# Patient Record
Sex: Female | Born: 1970 | Hispanic: Yes | Marital: Married | Smoking: Never smoker
Health system: Southern US, Community
[De-identification: ages and names within clinical notes are randomized; demographics above are authoritative.]

## PROBLEM LIST (undated history)

## (undated) DIAGNOSIS — E785 Hyperlipidemia, unspecified: Secondary | ICD-10-CM

## (undated) DIAGNOSIS — I10 Essential (primary) hypertension: Secondary | ICD-10-CM

## (undated) HISTORY — DX: Essential (primary) hypertension: I10

## (undated) HISTORY — PX: OTHER SURGICAL HISTORY: SHX169

## (undated) HISTORY — DX: Hyperlipidemia, unspecified: E78.5

---

## 2010-05-18 ENCOUNTER — Other Ambulatory Visit (HOSPITAL_COMMUNITY): Payer: Self-pay | Admitting: Family Medicine

## 2010-05-18 DIAGNOSIS — Z139 Encounter for screening, unspecified: Secondary | ICD-10-CM

## 2010-05-21 ENCOUNTER — Ambulatory Visit (HOSPITAL_COMMUNITY)
Admission: RE | Admit: 2010-05-21 | Discharge: 2010-05-21 | Disposition: A | Discharge status: Home / Self Care | Payer: Self-pay | Source: Ambulatory Visit | Attending: Family Medicine | Admitting: Family Medicine

## 2010-05-21 DIAGNOSIS — Z139 Encounter for screening, unspecified: Secondary | ICD-10-CM

## 2011-07-12 ENCOUNTER — Other Ambulatory Visit (HOSPITAL_COMMUNITY): Payer: Self-pay | Admitting: Nurse Practitioner

## 2011-07-12 DIAGNOSIS — Z139 Encounter for screening, unspecified: Secondary | ICD-10-CM

## 2011-07-15 ENCOUNTER — Ambulatory Visit (HOSPITAL_COMMUNITY)
Admission: RE | Admit: 2011-07-15 | Discharge: 2011-07-15 | Disposition: A | Discharge status: Home / Self Care | Payer: PRIVATE HEALTH INSURANCE | Source: Ambulatory Visit | Attending: Nurse Practitioner | Admitting: Nurse Practitioner

## 2011-07-15 DIAGNOSIS — Z1231 Encounter for screening mammogram for malignant neoplasm of breast: Secondary | ICD-10-CM | POA: Insufficient documentation

## 2011-07-15 DIAGNOSIS — Z139 Encounter for screening, unspecified: Secondary | ICD-10-CM

## 2013-02-05 ENCOUNTER — Encounter: Payer: Self-pay | Admitting: Gastroenterology

## 2013-02-28 ENCOUNTER — Encounter: Payer: Self-pay | Admitting: Gastroenterology

## 2013-02-28 ENCOUNTER — Ambulatory Visit (INDEPENDENT_AMBULATORY_CARE_PROVIDER_SITE_OTHER): Payer: Self-pay | Admitting: Gastroenterology

## 2013-02-28 ENCOUNTER — Encounter (INDEPENDENT_AMBULATORY_CARE_PROVIDER_SITE_OTHER): Payer: Self-pay

## 2013-02-28 VITALS — BP 170/97 | HR 77 | Temp 97.6°F | Ht 61.0 in | Wt 175.6 lb

## 2013-02-28 DIAGNOSIS — K644 Residual hemorrhoidal skin tags: Secondary | ICD-10-CM

## 2013-02-28 NOTE — Progress Notes (Signed)
Primary Care Physician:  Tylene FantasiaMUSE,ROCHELLE D., PA-C Primary Gastroenterologist:  Dr. Jena Gaussourk   Chief Complaint  Patient presents with  . Hemorrhoids    HPI:   Lauren Brock is a pleasant 43 year old female who presents today at the request of Greer EeHeather Parrish, FNP with the [facility]. She was referred due to question of a hemorrhoid versus possible mass. An interpreter was present for any language barriers. However, patient speaks [place] well.   No rectal pain. When area came out at first, itched and burning. Started Proctosol with improvement of symptoms. No rectal bleeding. Sometimes coffee stimulates to have a bowel movement. No straining with BMs. No significant change in bowel habits. Believes she was constipated when hemorrhoids flared.   Hypertensive, 170/91. Has appt with primary care in a few days.   Past Medical History  Diagnosis Date  . Hypertension     Past Surgical History  Procedure Laterality Date  . None      No current outpatient prescriptions on file.   No current facility-administered medications for this visit.    Allergies as of 02/28/2013  . (No Known Allergies)    Family History  Problem Relation Age of Onset  . Colon cancer Neg Hx     History   Social History  . Marital Status: Married    Spouse Name: N/A    Number of Children: N/A  . Years of Education: N/A   Occupational History  . Not on file.   Social History Main Topics  . Smoking status: Never Smoker   . Smokeless tobacco: Not on file  . Alcohol Use: No  . Drug Use: No  . Sexual Activity: Not on file   Other Topics Concern  . Not on file   Social History Narrative  . No narrative on file    Review of Systems: Gen: Denies any fever, chills, fatigue, weight loss, lack of appetite.  CV: Denies chest pain, heart palpitations, peripheral edema, syncope. Denies blurred vision, significant headaches Resp: +DOE GI: see HPI GU : Denies urinary burning, urinary  frequency, urinary hesitancy MS: right wrist chronic pain from repetitive movement Derm: Denies rash, itching, dry skin Psych: Denies depression, anxiety, memory loss, and confusion Heme: Denies bruising, bleeding, and enlarged lymph nodes.  Physical Exam: BP 170/97  Pulse 77  Temp(Src) 97.6 F (36.4 C) (Oral)  Ht 5\' 1"  (1.549 m)  Wt 175 lb 9.6 oz (79.652 kg)  BMI 33.20 kg/m2  LMP 02/14/2013 General:   Alert and oriented. Pleasant and cooperative. Well-nourished and well-developed.  Head:  Normocephalic and atraumatic. Eyes:  Without icterus, sclera clear and conjunctiva pink.  Ears:  Normal auditory acuity. Nose:  No deformity, discharge,  or lesions. Mouth:  No deformity or lesions, oral mucosa pink.  Neck:  Supple, without mass or thyromegaly. Lungs:  Clear to auscultation bilaterally. No wheezes, rales, or rhonchi. No distress.  Heart:  S1, S2 present without murmurs appreciated.  Abdomen:  +BS, soft, non-tender and non-distended. No HSM noted. No guarding or rebound. No masses appreciated.  Rectal:  External exam with small, raised area consistent with resolving external hemorrhoid; internal exam without mass, stricture, gross blood, or discomfort.  Msk:  Symmetrical without gross deformities. Normal posture. Extremities:  Without clubbing or edema. Neurologic:  Alert and  oriented x4;  grossly normal neurologically. Skin:  Intact without significant lesions or rashes. Cervical Nodes:  No significant cervical adenopathy. Psych:  Alert and cooperative. Normal mood and affect.

## 2013-02-28 NOTE — Patient Instructions (Signed)
Continue to use the hemorrhoid cream twice a day for 7 days.   We will see you back in 4 weeks. Do not strain or sit on the toilet for long periods of time. Take extra fiber daily such as Metamucil or Benefiber.   Hemorrhoids Hemorrhoids are swollen veins around the rectum or anus. There are two types of hemorrhoids:   Internal hemorrhoids. These occur in the veins just inside the rectum. They may poke through to the outside and become irritated and painful.  External hemorrhoids. These occur in the veins outside the anus and can be felt as a painful swelling or hard lump near the anus. CAUSES  Pregnancy.   Obesity.   Constipation or diarrhea.   Straining to have a bowel movement.   Sitting for long periods on the toilet.  Heavy lifting or other activity that caused you to strain.  Anal intercourse. SYMPTOMS   Pain.   Anal itching or irritation.   Rectal bleeding.   Fecal leakage.   Anal swelling.   One or more lumps around the anus.  DIAGNOSIS  Your caregiver may be able to diagnose hemorrhoids by visual examination. Other examinations or tests that may be performed include:   Examination of the rectal area with a gloved hand (digital rectal exam).   Examination of anal canal using a small tube (scope).   A blood test if you have lost a significant amount of blood.  A test to look inside the colon (sigmoidoscopy or colonoscopy). TREATMENT Most hemorrhoids can be treated at home. However, if symptoms do not seem to be getting better or if you have a lot of rectal bleeding, your caregiver may perform a procedure to help make the hemorrhoids get smaller or remove them completely. Possible treatments include:   Placing a rubber band at the base of the hemorrhoid to cut off the circulation (rubber band ligation).   Injecting a chemical to shrink the hemorrhoid (sclerotherapy).   Using a tool to burn the hemorrhoid (infrared light therapy).    Surgically removing the hemorrhoid (hemorrhoidectomy).   Stapling the hemorrhoid to block blood flow to the tissue (hemorrhoid stapling).  HOME CARE INSTRUCTIONS   Eat foods with fiber, such as whole grains, beans, nuts, fruits, and vegetables. Ask your doctor about taking products with added fiber in them (fibersupplements).  Increase fluid intake. Drink enough water and fluids to keep your urine clear or pale yellow.   Exercise regularly.   Go to the bathroom when you have the urge to have a bowel movement. Do not wait.   Avoid straining to have bowel movements.   Keep the anal area dry and clean. Use wet toilet paper or moist towelettes after a bowel movement.   Medicated creams and suppositories may be used or applied as directed.   Only take over-the-counter or prescription medicines as directed by your caregiver.   Take warm sitz baths for 15 20 minutes, 3 4 times a day to ease pain and discomfort.   Place ice packs on the hemorrhoids if they are tender and swollen. Using ice packs between sitz baths may be helpful.   Put ice in a plastic bag.   Place a towel between your skin and the bag.   Leave the ice on for 15 20 minutes, 3 4 times a day.   Do not use a donut-shaped pillow or sit on the toilet for long periods. This increases blood pooling and pain.  SEEK MEDICAL CARE IF:  You  have increasing pain and swelling that is not controlled by treatment or medicine.  You have uncontrolled bleeding.  You have difficulty or you are unable to have a bowel movement.  You have pain or inflammation outside the area of the hemorrhoids. MAKE SURE YOU:  Understand these instructions.  Will watch your condition.  Will get help right away if you are not doing well or get worse. Document Released: 01/16/2000 Document Revised: 01/05/2012 Document Reviewed: 11/23/2011 [employer] Patient Information 2014 [employer] [place].

## 2013-03-01 DIAGNOSIS — K644 Residual hemorrhoidal skin tags: Secondary | ICD-10-CM | POA: Insufficient documentation

## 2013-03-01 NOTE — Assessment & Plan Note (Signed)
43 year old female with small, resolving external hemorrhoid and no concerning signs such as rectal bleeding, change in bowel habits, or other red flags. From her description, the external hemorrhoid was much larger and has significantly decreased in size over past few weeks. I have asked her to use Proctosol BID for another 7 days and add additional fiber to her diet. We also discussed avoidance of straining and sitting for long periods of time. For now, colonoscopy not warranted unless signs of rectal bleeding. Close follow-up in 4 weeks.

## 2013-03-02 ENCOUNTER — Ambulatory Visit (HOSPITAL_COMMUNITY)
Admission: RE | Admit: 2013-03-02 | Discharge: 2013-03-02 | Disposition: A | Discharge status: Home / Self Care | Payer: Self-pay | Source: Ambulatory Visit | Attending: *Deleted | Admitting: *Deleted

## 2013-03-02 ENCOUNTER — Other Ambulatory Visit (HOSPITAL_COMMUNITY): Payer: Self-pay | Admitting: *Deleted

## 2013-03-02 DIAGNOSIS — Z1231 Encounter for screening mammogram for malignant neoplasm of breast: Secondary | ICD-10-CM

## 2013-03-06 NOTE — Progress Notes (Signed)
cc'd to pcp 

## 2013-03-21 ENCOUNTER — Telehealth: Payer: Self-pay

## 2013-03-21 NOTE — Telephone Encounter (Signed)
FAXED OV NOTE TO [facility] AT THE [facility].

## 2013-03-21 NOTE — Telephone Encounter (Signed)
Pt was referred from the health dept. Phone call from [facility], they never received info. Please send the OV note to them at fax number [phone] and Put attn [facility] on it.   Routing to [place] to do this.

## 2013-03-28 ENCOUNTER — Encounter: Payer: Self-pay | Admitting: Gastroenterology

## 2013-03-28 ENCOUNTER — Ambulatory Visit (INDEPENDENT_AMBULATORY_CARE_PROVIDER_SITE_OTHER): Payer: Self-pay | Admitting: Gastroenterology

## 2013-03-28 VITALS — BP 148/93 | HR 71 | Temp 98.2°F | Wt 176.2 lb

## 2013-03-28 DIAGNOSIS — K644 Residual hemorrhoidal skin tags: Secondary | ICD-10-CM

## 2013-03-28 NOTE — Patient Instructions (Signed)
Everything looks great!!  If you have any further issues with hemorrhoids, notice any rectal bleeding, or anything abnormal, call us right away. We will then proceed with a colonoscopy. However, we will see you back as needed from now on.   You will be due for your first colonoscopy at age 43.

## 2013-03-28 NOTE — Assessment & Plan Note (Signed)
43 year old female with improved/resolved external hemorrhoid; now only with small residual skin tag. She has had no change in bowel habits, rectal bleeding, abdominal pain. Improvement with supportive measures and prescription cream. Will see back prn. I have discussed signs and symptoms that would necessitate a colonoscopy. For now, no further intervention needed.

## 2013-03-28 NOTE — Progress Notes (Signed)
    Referring Provider: Tylene FantasiaMuse, Rochelle D., PA-C Primary Care Physician:  Tylene FantasiaMUSE,ROCHELLE D., PA-C Primary GI: Dr. Jena Gaussourk   Chief Complaint  Patient presents with  . Follow-up    HPI:   Lauren Brock presents today in follow-up with a history of hemorrhoids. No prior colonoscopy. She has had no rectal bleeding whatsoever. No change in bowel habits, abdominal pain, weight loss. No rectal discomfort. At last visit, a small, resolving external hemorrhoid was noted. Prescription cream provided to patient, and she has used this with great results.    Past Medical History  Diagnosis Date  . Hypertension     Past Surgical History  Procedure Laterality Date  . None      Current Outpatient Prescriptions  Medication Sig Dispense Refill  . lisinopril (PRINIVIL,ZESTRIL) 20 MG tablet Take 20 mg by mouth daily.       No current facility-administered medications for this visit.    Allergies as of 03/28/2013  . (No Known Allergies)    Family History  Problem Relation Age of Onset  . Colon cancer Neg Hx     History   Social History  . Marital Status: Married    Spouse Name: N/A    Number of Children: N/A  . Years of Education: N/A   Social History Main Topics  . Smoking status: Never Smoker   . Smokeless tobacco: None  . Alcohol Use: No  . Drug Use: No  . Sexual Activity: None   Other Topics Concern  . None   Social History Narrative  . None    Review of Systems: As mentioned in HPI.   Physical Exam: BP 148/93  Pulse 71  Temp(Src) 98.2 F (36.8 C) (Oral)  Wt 176 lb 3.2 oz (79.924 kg)  LMP 03/21/2013 General:   Alert and oriented. No distress noted. Pleasant and cooperative.  Abdomen:  +BS, soft, non-tender and non-distended. No rebound or guarding. No HSM or masses noted. Rectal: external exam only; no mass, fissure, or evidence of thrombosed hemorrhoid. Very small residual skin tag at [employer].  Neurologic:  Alert and  oriented x4;  grossly normal  neurologically. Skin:  Intact without significant lesions or rashes. Psych:  Alert and cooperative. Normal mood and affect.

## 2013-03-28 NOTE — Progress Notes (Signed)
cc'd to pcp 

## 2014-10-29 ENCOUNTER — Other Ambulatory Visit (HOSPITAL_COMMUNITY): Payer: Self-pay | Admitting: Nurse Practitioner

## 2014-10-29 DIAGNOSIS — Z1231 Encounter for screening mammogram for malignant neoplasm of breast: Secondary | ICD-10-CM

## 2014-11-11 ENCOUNTER — Ambulatory Visit (HOSPITAL_COMMUNITY)
Admission: RE | Admit: 2014-11-11 | Discharge: 2014-11-11 | Disposition: A | Discharge status: Home / Self Care | Payer: Self-pay | Source: Ambulatory Visit | Attending: Nurse Practitioner | Admitting: Nurse Practitioner

## 2014-11-11 DIAGNOSIS — Z1231 Encounter for screening mammogram for malignant neoplasm of breast: Secondary | ICD-10-CM

## 2015-10-30 ENCOUNTER — Other Ambulatory Visit (HOSPITAL_COMMUNITY): Payer: Self-pay | Admitting: Nurse Practitioner

## 2015-10-30 DIAGNOSIS — Z1231 Encounter for screening mammogram for malignant neoplasm of breast: Secondary | ICD-10-CM

## 2015-11-13 ENCOUNTER — Ambulatory Visit (HOSPITAL_COMMUNITY)
Admission: RE | Admit: 2015-11-13 | Discharge: 2015-11-13 | Disposition: A | Discharge status: Home / Self Care | Payer: Self-pay | Source: Ambulatory Visit | Attending: Nurse Practitioner | Admitting: Nurse Practitioner

## 2015-11-13 DIAGNOSIS — Z1231 Encounter for screening mammogram for malignant neoplasm of breast: Secondary | ICD-10-CM

## 2015-11-18 ENCOUNTER — Other Ambulatory Visit: Payer: Self-pay | Admitting: Nurse Practitioner

## 2015-11-18 DIAGNOSIS — R928 Other abnormal and inconclusive findings on diagnostic imaging of breast: Secondary | ICD-10-CM

## 2015-12-23 ENCOUNTER — Other Ambulatory Visit (HOSPITAL_COMMUNITY): Payer: Self-pay | Admitting: *Deleted

## 2015-12-23 DIAGNOSIS — N6310 Unspecified lump in the right breast, unspecified quadrant: Secondary | ICD-10-CM

## 2016-01-13 ENCOUNTER — Ambulatory Visit (HOSPITAL_COMMUNITY)
Admission: RE | Admit: 2016-01-13 | Discharge: 2016-01-13 | Disposition: A | Discharge status: Home / Self Care | Payer: PRIVATE HEALTH INSURANCE | Source: Ambulatory Visit | Attending: *Deleted | Admitting: *Deleted

## 2016-01-13 DIAGNOSIS — N6310 Unspecified lump in the right breast, unspecified quadrant: Secondary | ICD-10-CM

## 2016-01-13 DIAGNOSIS — R928 Other abnormal and inconclusive findings on diagnostic imaging of breast: Secondary | ICD-10-CM | POA: Insufficient documentation

## 2016-11-01 ENCOUNTER — Other Ambulatory Visit (HOSPITAL_COMMUNITY): Payer: Self-pay | Admitting: Nurse Practitioner

## 2016-11-01 DIAGNOSIS — N858 Other specified noninflammatory disorders of uterus: Secondary | ICD-10-CM

## 2016-11-08 ENCOUNTER — Ambulatory Visit (HOSPITAL_COMMUNITY): Payer: PRIVATE HEALTH INSURANCE

## 2016-11-08 ENCOUNTER — Ambulatory Visit (HOSPITAL_COMMUNITY)
Admission: RE | Admit: 2016-11-08 | Discharge: 2016-11-08 | Disposition: A | Discharge status: Home / Self Care | Payer: PRIVATE HEALTH INSURANCE | Source: Ambulatory Visit | Attending: Nurse Practitioner | Admitting: Nurse Practitioner

## 2016-11-08 DIAGNOSIS — N858 Other specified noninflammatory disorders of uterus: Secondary | ICD-10-CM

## 2016-11-08 DIAGNOSIS — N83291 Other ovarian cyst, right side: Secondary | ICD-10-CM | POA: Insufficient documentation

## 2017-01-18 ENCOUNTER — Other Ambulatory Visit (HOSPITAL_COMMUNITY): Payer: Self-pay | Admitting: *Deleted

## 2017-01-18 DIAGNOSIS — Z1231 Encounter for screening mammogram for malignant neoplasm of breast: Secondary | ICD-10-CM

## 2017-01-31 ENCOUNTER — Ambulatory Visit (HOSPITAL_COMMUNITY)
Admission: RE | Admit: 2017-01-31 | Discharge: 2017-01-31 | Disposition: A | Discharge status: Home / Self Care | Payer: PRIVATE HEALTH INSURANCE | Source: Ambulatory Visit | Attending: *Deleted | Admitting: *Deleted

## 2017-01-31 DIAGNOSIS — Z1231 Encounter for screening mammogram for malignant neoplasm of breast: Secondary | ICD-10-CM | POA: Diagnosis present

## 2019-05-22 ENCOUNTER — Other Ambulatory Visit (HOSPITAL_COMMUNITY): Payer: Self-pay | Admitting: *Deleted

## 2019-05-22 DIAGNOSIS — Z1231 Encounter for screening mammogram for malignant neoplasm of breast: Secondary | ICD-10-CM

## 2019-06-04 ENCOUNTER — Ambulatory Visit (HOSPITAL_COMMUNITY): Payer: PRIVATE HEALTH INSURANCE

## 2019-06-08 ENCOUNTER — Ambulatory Visit (HOSPITAL_COMMUNITY)
Admission: RE | Admit: 2019-06-08 | Discharge: 2019-06-08 | Disposition: A | Discharge status: Home / Self Care | Payer: PRIVATE HEALTH INSURANCE | Source: Ambulatory Visit | Attending: *Deleted | Admitting: *Deleted

## 2019-06-08 ENCOUNTER — Other Ambulatory Visit: Payer: Self-pay

## 2019-06-08 DIAGNOSIS — Z1231 Encounter for screening mammogram for malignant neoplasm of breast: Secondary | ICD-10-CM | POA: Insufficient documentation

## 2019-08-08 ENCOUNTER — Other Ambulatory Visit: Payer: Self-pay

## 2019-08-08 ENCOUNTER — Other Ambulatory Visit (HOSPITAL_COMMUNITY): Payer: Self-pay | Admitting: *Deleted

## 2019-08-08 ENCOUNTER — Ambulatory Visit (HOSPITAL_COMMUNITY)
Admission: RE | Admit: 2019-08-08 | Discharge: 2019-08-08 | Disposition: A | Discharge status: Home / Self Care | Payer: PRIVATE HEALTH INSURANCE | Source: Ambulatory Visit | Attending: *Deleted | Admitting: *Deleted

## 2019-08-08 DIAGNOSIS — Z1231 Encounter for screening mammogram for malignant neoplasm of breast: Secondary | ICD-10-CM | POA: Diagnosis present

## 2021-05-13 IMAGING — MG DIGITAL SCREENING BILAT W/ TOMO W/ CAD
6 of 10 series · 6 of 30 positions shown · non-contrast
Comparison: Previous exam(s).

CLINICAL DATA: Screening.

EXAM:
DIGITAL SCREENING BILATERAL MAMMOGRAM WITH TOMO AND CAD

[L MLO synth-2D (1 of 2)]
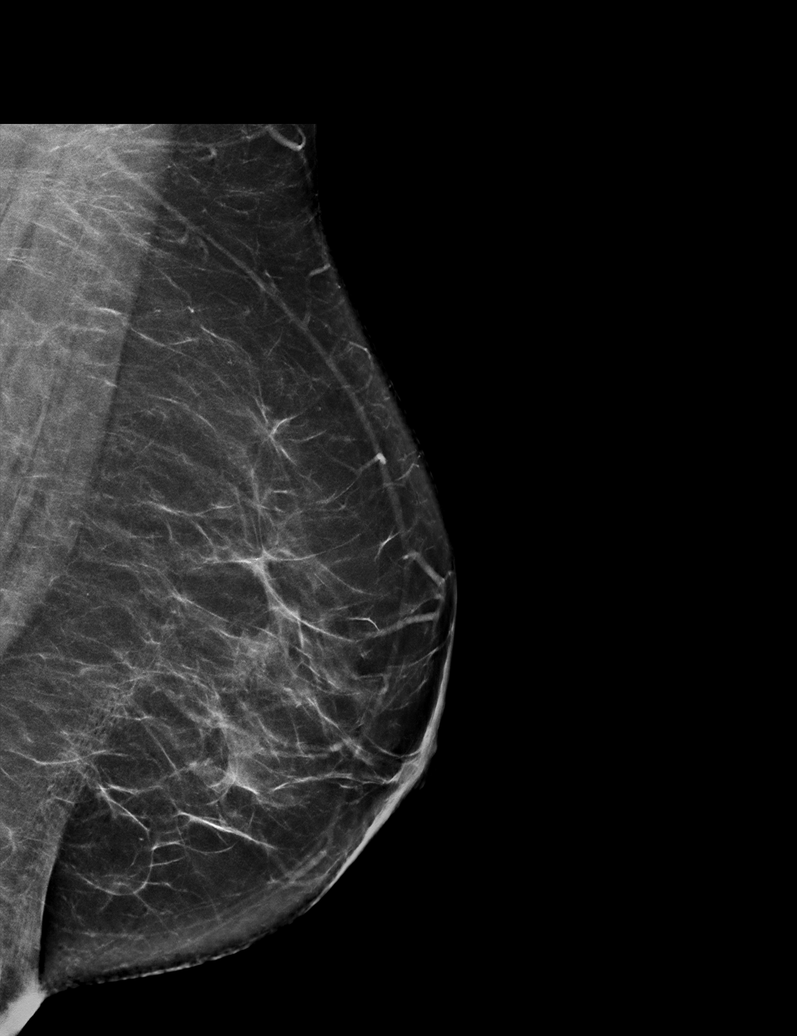

[L CC synth-2D]
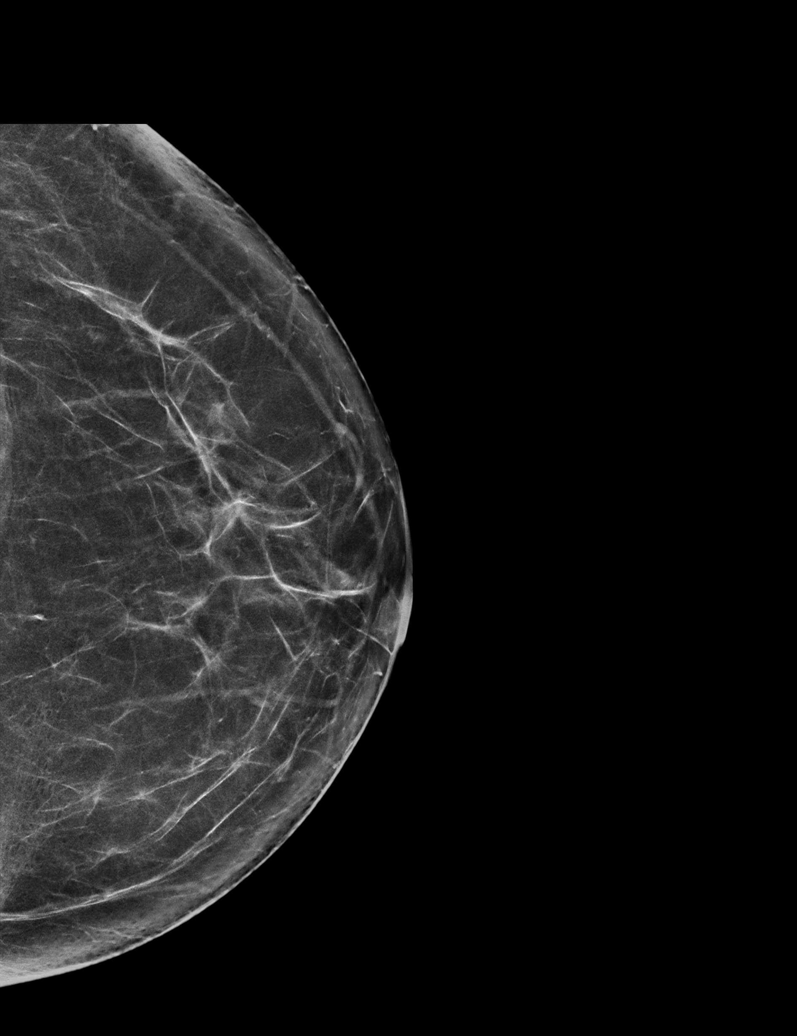

[R CC synth-2D]
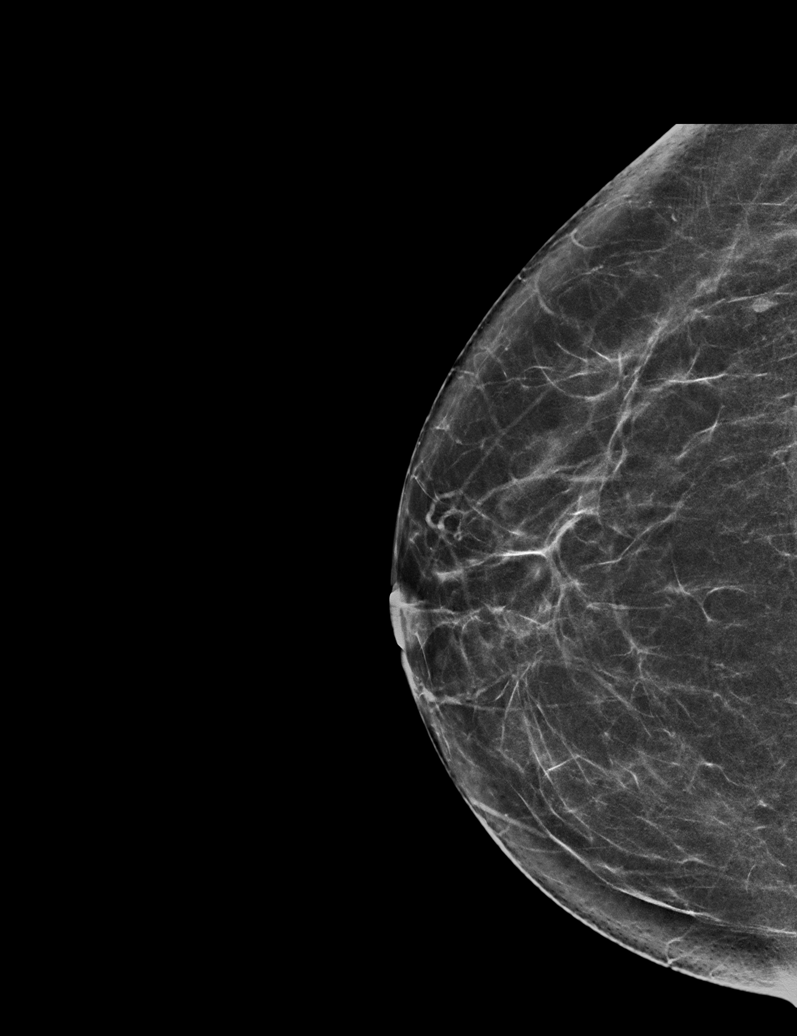

[L MLO synth-2D (2 of 2)]
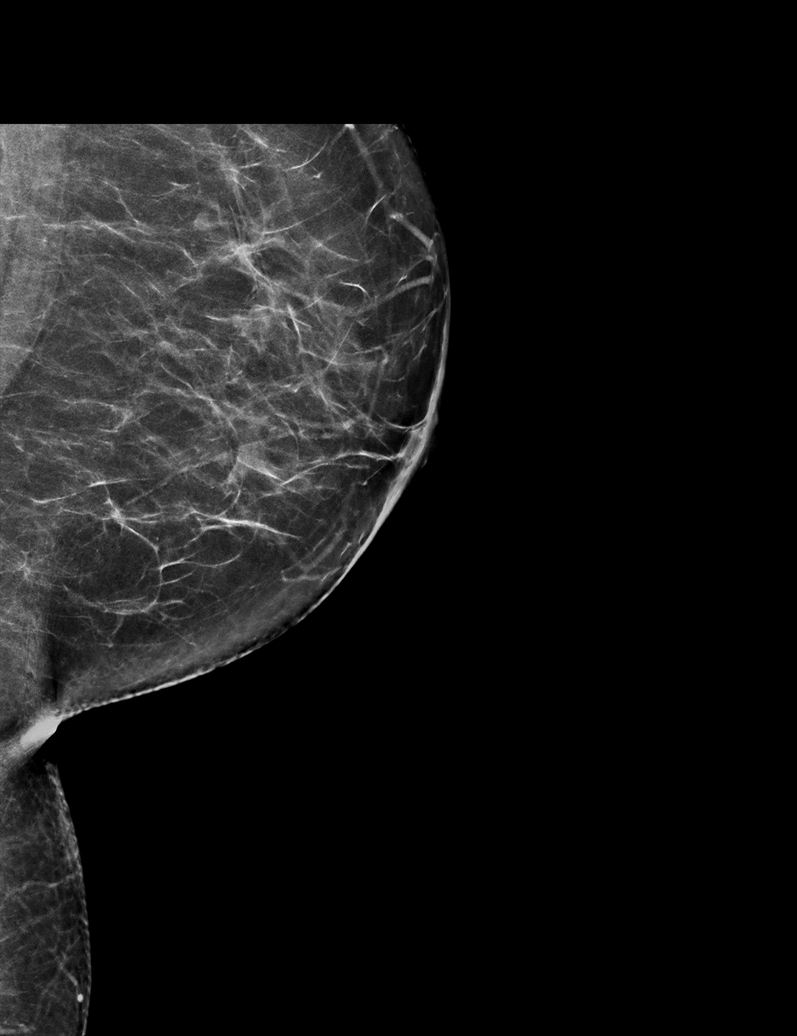

[R MLO synth-2D]
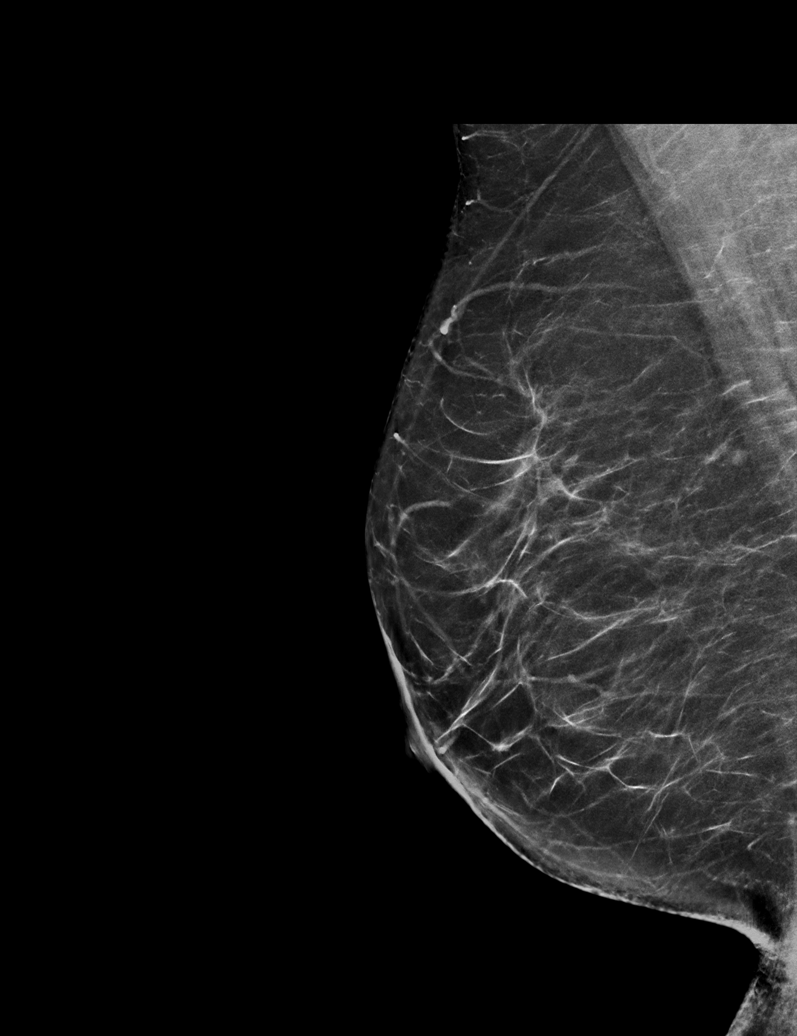

[L MLO tomo · tomo slice 39/76.0]
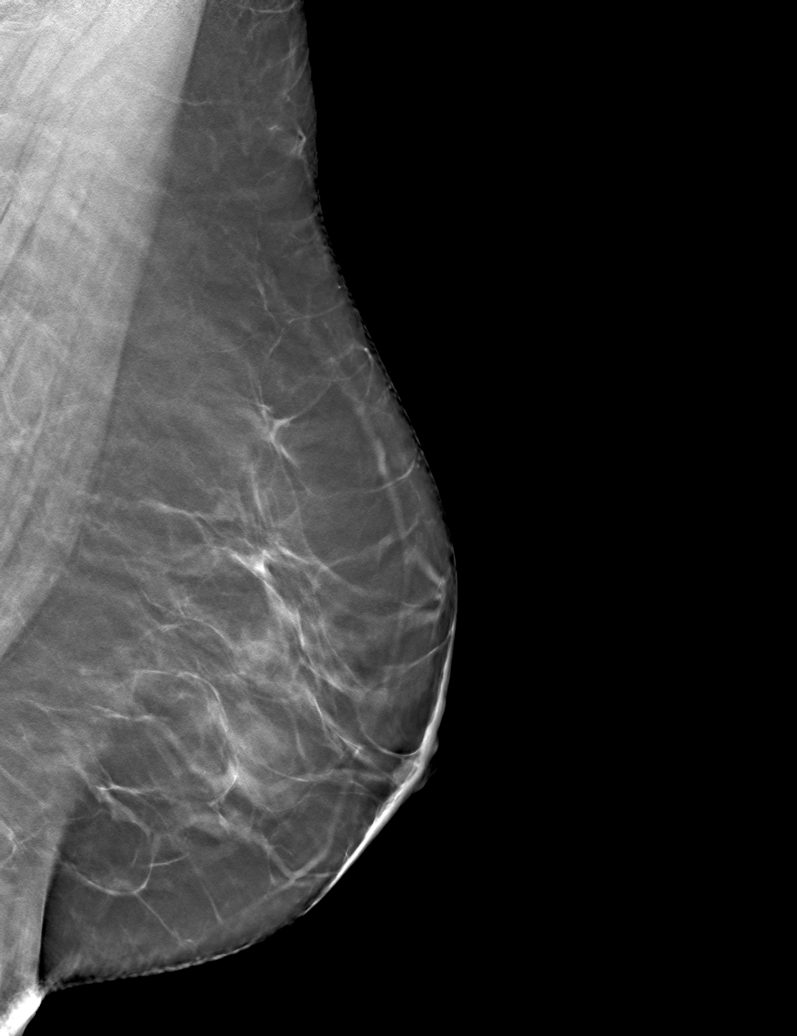

[6 of 30 positions shown; findings below may reference images not displayed]

ACR Breast Density Category b: There are scattered areas of
fibroglandular density.
FINDINGS: There are no findings suspicious for malignancy. Images were
processed with CAD.
IMPRESSION: No mammographic evidence of malignancy. A result letter of this
screening mammogram will be mailed directly to the patient.

RECOMMENDATION:
Screening mammogram in one year. (Code:CN-U-775)

BI-RADS CATEGORY  1: Negative.

## 2021-08-27 ENCOUNTER — Other Ambulatory Visit (HOSPITAL_COMMUNITY): Payer: Self-pay | Admitting: Obstetrics and Gynecology

## 2021-08-27 DIAGNOSIS — M79622 Pain in left upper arm: Secondary | ICD-10-CM

## 2021-10-16 ENCOUNTER — Inpatient Hospital Stay: Payer: Self-pay | Attending: Obstetrics and Gynecology | Admitting: Hematology and Oncology

## 2021-10-16 ENCOUNTER — Other Ambulatory Visit: Payer: Self-pay

## 2021-10-16 VITALS — BP 138/91 | Wt 172.4 lb

## 2021-10-16 DIAGNOSIS — Z1211 Encounter for screening for malignant neoplasm of colon: Secondary | ICD-10-CM

## 2021-10-16 DIAGNOSIS — M79622 Pain in left upper arm: Secondary | ICD-10-CM

## 2021-10-16 NOTE — Progress Notes (Signed)
Ms. Maxyne Derocher is a 51 y.o. female who presents to [facility] clinic today with complaint of left axillary pain x 3-4 months.    Pap Smear: Pap not smear completed today. Last Pap smear was 05/22/19 at [facility] clinic and was normal. Per patient has no history of an abnormal Pap smear. Last Pap smear result is available in Epic.   Physical exam: Breasts Breasts symmetrical. No skin abnormalities bilateral breasts. No nipple retraction bilateral breasts. No nipple discharge bilateral breasts. No lymphadenopathy. No lumps palpated bilateral breasts.  MS DIGITAL SCREENING TOMO BILATERAL  Result Date: 08/09/2019 CLINICAL DATA:  Screening. EXAM: DIGITAL SCREENING BILATERAL MAMMOGRAM WITH TOMO AND CAD COMPARISON:  Previous exam(s). ACR Breast Density Category b: There are scattered areas of fibroglandular density. FINDINGS: There are no findings suspicious for malignancy. Images were processed with CAD. IMPRESSION: No mammographic evidence of malignancy. A result letter of this screening mammogram will be mailed directly to the patient. RECOMMENDATION: Screening mammogram in one year. (Code:SM-B-01Y) BI-RADS CATEGORY  1: Negative. Electronically Signed   By: Gerome Sam III M.D   On: 08/09/2019 14:48   MS DIGITAL SCREENING TOMO BILATERAL  Result Date: 01/31/2017 CLINICAL DATA:  Screening. EXAM: 2D DIGITAL SCREENING BILATERAL MAMMOGRAM WITH CAD AND ADJUNCT TOMO COMPARISON:  Previous exam(s). ACR Breast Density Category b: There are scattered areas of fibroglandular density. FINDINGS: There are no findings suspicious for malignancy. Images were processed with CAD. IMPRESSION: No mammographic evidence of malignancy. A result letter of this screening mammogram will be mailed directly to the patient. RECOMMENDATION: Screening mammogram in one year. (Code:SM-B-01Y) BI-RADS CATEGORY  1: Negative. Electronically Signed   By: Bary Richard M.D.   On: 01/31/2017 15:01          Pelvic/Bimanual Pap is not indicated today    Smoking History: Patient has never smoked and was not referred to quit line.    Patient Navigation: Patient education provided. Access to services provided for patient through [facility] program. Samul Dada interpreter provided. No transportation provided   Colorectal Cancer Screening: Per patient has never had colonoscopy completed No complaints today. FIT test given.   Breast and Cervical Cancer Risk Assessment: Patient does not have family history of breast cancer, known genetic mutations, or radiation treatment to the chest before age 16. Patient does not have history of cervical dysplasia, immunocompromised, or DES exposure in-utero.  Risk Assessment   No risk assessment data     A: [facility] exam without pap smear Complaint of left axillary pain with benign exam.  P: [facility] Appointment scheduled 10/20/21.  Pascal Lux, NP 10/16/2021 8:49 AM

## 2021-10-16 NOTE — Patient Instructions (Signed)
Discussed process of diagnostic mammogram including that physician will discuss results same day. We reviewed that she will continue with annual mammograms and pap smears when she is due through our program. Questions were asked and answered.

## 2021-10-20 ENCOUNTER — Ambulatory Visit (HOSPITAL_COMMUNITY)
Admission: RE | Admit: 2021-10-20 | Discharge: 2021-10-20 | Disposition: A | Discharge status: Home / Self Care | Payer: Self-pay | Source: Ambulatory Visit | Attending: Obstetrics and Gynecology | Admitting: Obstetrics and Gynecology

## 2021-10-20 DIAGNOSIS — M79622 Pain in left upper arm: Secondary | ICD-10-CM

## 2021-10-23 ENCOUNTER — Telehealth: Payer: Self-pay

## 2021-10-23 LAB — FECAL OCCULT BLOOD, IMMUNOCHEMICAL: Fecal Occult Bld: NEGATIVE

## 2021-10-23 NOTE — Telephone Encounter (Signed)
Via Julie Sowell, Spanish Interpreter ([place]), Patient informed negative FIT test results, verbalized understanding. 

## 2022-11-18 ENCOUNTER — Telehealth: Payer: Self-pay

## 2022-11-18 NOTE — Telephone Encounter (Signed)
Telephoned patient at mobile number using interpreter#[phone]. Left voice message with [facility] contact information.
